# Patient Record
Sex: Female | Born: 1950 | Race: White | Hispanic: No | Smoking: Never smoker
Health system: Southern US, Community
[De-identification: ages and names within clinical notes are randomized; demographics above are authoritative.]

## PROBLEM LIST (undated history)

## (undated) DIAGNOSIS — E119 Type 2 diabetes mellitus without complications: Secondary | ICD-10-CM

## (undated) HISTORY — PX: ANKLE RECONSTRUCTION: SHX1151

## (undated) HISTORY — PX: CHOLECYSTECTOMY: SHX55

---

## 1998-08-07 ENCOUNTER — Other Ambulatory Visit: Admission: RE | Admit: 1998-08-07 | Discharge: 1998-08-07 | Payer: Self-pay | Admitting: Obstetrics

## 1999-04-13 ENCOUNTER — Ambulatory Visit (HOSPITAL_COMMUNITY): Admission: RE | Admit: 1999-04-13 | Discharge: 1999-04-13 | Payer: Self-pay | Admitting: Gastroenterology

## 1999-04-13 ENCOUNTER — Encounter: Payer: Self-pay | Admitting: Gastroenterology

## 1999-04-30 ENCOUNTER — Observation Stay (HOSPITAL_COMMUNITY): Admission: RE | Admit: 1999-04-30 | Discharge: 1999-05-01 | Payer: Self-pay

## 2001-03-02 ENCOUNTER — Emergency Department (HOSPITAL_COMMUNITY): Admission: EM | Admit: 2001-03-02 | Discharge: 2001-03-02 | Payer: Self-pay | Admitting: Internal Medicine

## 2001-05-10 ENCOUNTER — Encounter: Payer: Self-pay | Admitting: Emergency Medicine

## 2001-05-10 ENCOUNTER — Emergency Department (HOSPITAL_COMMUNITY): Admission: EM | Admit: 2001-05-10 | Discharge: 2001-05-10 | Payer: Self-pay | Admitting: Emergency Medicine

## 2002-05-02 ENCOUNTER — Other Ambulatory Visit: Admission: RE | Admit: 2002-05-02 | Discharge: 2002-05-02 | Payer: Self-pay | Admitting: Family Medicine

## 2002-10-01 ENCOUNTER — Ambulatory Visit (HOSPITAL_COMMUNITY): Admission: RE | Admit: 2002-10-01 | Discharge: 2002-10-01 | Payer: Self-pay | Admitting: Gastroenterology

## 2004-04-06 ENCOUNTER — Other Ambulatory Visit: Admission: RE | Admit: 2004-04-06 | Discharge: 2004-04-06 | Payer: Self-pay | Admitting: Family Medicine

## 2006-01-24 ENCOUNTER — Other Ambulatory Visit: Admission: RE | Admit: 2006-01-24 | Discharge: 2006-01-24 | Payer: Self-pay | Admitting: Family Medicine

## 2008-12-27 ENCOUNTER — Encounter: Admission: RE | Admit: 2008-12-27 | Discharge: 2008-12-27 | Payer: Self-pay | Admitting: Family Medicine

## 2009-05-10 ENCOUNTER — Inpatient Hospital Stay (HOSPITAL_COMMUNITY): Admission: EM | Admit: 2009-05-10 | Discharge: 2009-05-13 | Payer: Self-pay | Admitting: Emergency Medicine

## 2009-05-10 ENCOUNTER — Ambulatory Visit: Payer: Self-pay | Admitting: Diagnostic Radiology

## 2009-05-10 ENCOUNTER — Encounter: Payer: Self-pay | Admitting: Emergency Medicine

## 2011-02-22 LAB — GLUCOSE, CAPILLARY
Glucose-Capillary: 141 mg/dL — ABNORMAL HIGH (ref 70–99)
Glucose-Capillary: 153 mg/dL — ABNORMAL HIGH (ref 70–99)
Glucose-Capillary: 168 mg/dL — ABNORMAL HIGH (ref 70–99)
Glucose-Capillary: 180 mg/dL — ABNORMAL HIGH (ref 70–99)
Glucose-Capillary: 183 mg/dL — ABNORMAL HIGH (ref 70–99)
Glucose-Capillary: 229 mg/dL — ABNORMAL HIGH (ref 70–99)
Glucose-Capillary: 275 mg/dL — ABNORMAL HIGH (ref 70–99)

## 2011-02-22 LAB — DIFFERENTIAL
Basophils Absolute: 0.1 10*3/uL (ref 0.0–0.1)
Basophils Relative: 1 % (ref 0–1)
Neutro Abs: 3.7 10*3/uL (ref 1.7–7.7)
Neutrophils Relative %: 64 % (ref 43–77)

## 2011-02-22 LAB — CBC
MCHC: 33.7 g/dL (ref 30.0–36.0)
Platelets: 262 10*3/uL (ref 150–400)
RDW: 12.3 % (ref 11.5–15.5)

## 2011-02-22 LAB — HEMOGLOBIN A1C: Hgb A1c MFr Bld: 7.6 % — ABNORMAL HIGH (ref 4.6–6.1)

## 2011-02-22 LAB — BASIC METABOLIC PANEL
CO2: 24 mEq/L (ref 19–32)
Calcium: 8.7 mg/dL (ref 8.4–10.5)
Creatinine, Ser: 0.7 mg/dL (ref 0.4–1.2)
GFR calc Af Amer: >60 mL/min (ref >60)
Glucose, Bld: 255 mg/dL — ABNORMAL HIGH (ref 70–99)

## 2011-02-22 LAB — PROTIME-INR
INR: 0.9 (ref 0.00–1.49)
Prothrombin Time: 12.5 seconds (ref 11.6–15.2)

## 2011-02-22 LAB — APTT: aPTT: 29 seconds (ref 24–37)

## 2011-03-30 NOTE — H&P (Signed)
NAMEDIONE, PETRON NO.:  [id number]   MEDICAL RECORD NO.:  [id number]          PATIENT TYPE:  INP   LOCATION:  0098                         FACILITY:  [facility]   PHYSICIAN:  Myrtie Neither, MD      DATE OF BIRTH:  Nov 23, 1950   DATE OF ADMISSION:  05/10/2009  DATE OF DISCHARGE:                              HISTORY & PHYSICAL   CHIEF COMPLAINT:  Painful, deformed, left ankle.   HISTORY OF PRESENT ILLNESS:  This is a 60 year old female, who states  she was out running her dog for some exercise and lost her footing,  twisting her left ankle.  The patient was unable to get off the surface  and was unable to walk due to pain in the left ankle.  The patient was  initially seen at [facility] and found to have a  bimalleolar fracture of the left ankle with subluxation and had gentle  reduction of the subluxation and posterior splint applied.  The patient  was then transferred to [facility] for further treatment.  The  patient denies any loss of consciousness or any other injuries.   PAST MEDICAL HISTORY:  1. Diabetes mellitus, type 2.  2. Cholecystectomy.   ALLERGIES:  1. COMPAZINE.  2. HYDROCODONE.   MEDICATIONS:  Metformin 500 mg t.i.d.   SOCIAL HISTORY:  Occasional use of alcohol, but denies use of tobacco or  illegal drugs.   FAMILY HISTORY:  Sibling with diabetes mellitus and cancer as well as in  her parents, and hypertension.   PHYSICAL EXAMINATION:  GENERAL:  Alert and oriented in no acute distress  lying in the supine position with posterior splint on left lower  extremity.  VITAL SIGNS:  Temperature 97.4, pulse 98, respiration 18, blood pressure  134/59, O2 saturation 99%.  HEAD:  Normocephalic.  Sclerae are clear.  NECK:  Supple.  CHEST:  Clear.  CARDIOVASCULAR:  Regular.  EXTREMITY:  Left lower extremity with a posterior splint in place, the  nailbeds pink and blanch sluggishly, cool to touch.  Patient is able to  actively move the toes quite well.   X-RAY:  Bimalleolar fracture and subluxation, left ankle.   IMPRESSION:  Bimalleolar fracture and subluxation, left ankle.   PLAN:  Open reduction and internal fixation of the left ankle.      Myrtie Neither, MD  Electronically Signed     AC/MEDQ  D:  05/10/2009  T:  05/10/2009  Job:  [id number]

## 2011-03-30 NOTE — Op Note (Signed)
NAMESARYN, CHERRY NO.:  [id number]   MEDICAL RECORD NO.:  [id number]          PATIENT TYPE:  INP   LOCATION:  1603                         FACILITY:  [facility]   PHYSICIAN:  Myrtie Neither, MD      DATE OF BIRTH:  1951/08/03   DATE OF PROCEDURE:  05/10/2009  DATE OF DISCHARGE:                               OPERATIVE REPORT   PREOPERATIVE DIAGNOSIS:  Bimalleolar fracture left ankle.   POSTOPERATIVE DIAGNOSIS:  Bimalleolar fracture left ankle.   ANESTHESIA:  General.   PROCEDURE:  Open reduction internal fixation bimalleolar ankle fracture.   The patient was taken to the operating room after given adequate preop  medications, given general anesthesia and intubated.  The left lower  extremity was prepped with DuraPrep and draped in a sterile manner.  A  tourniquet and Bovie were used for hemostasis.  Mini C-arm was used to  visualize the fracture reduction.  A lateral incision made over the left  ankle going through the skin and subcutaneous tissue.  Soft tissue  subperiosteally elevated from the fracture site.  Manipulated reduction  was done and held with a bone clamp.  A six-hole tubular plate was  placed, applied over the fracture site, holding it in anatomic position.  Five screws were used to stabilize the fracture.  Irrigation was done  followed by wound closure, 2-0 Vicryl for the subcutaneous tissue and  skin staples for the skin.  A medial malleolar incision made going  through the skin and subcutaneous tissue down to the fracture site.  Soft tissue was subperiosteally elevated from the fracture site and  anatomic reduction was obtained with a towel clamp and stabilized with a  K-wire.  Two cannulated screws were placed across the medial malleolus,  holding it in a stable and anatomic position.  Copious irrigation was  done.  Wound closure was then done with 2-0 Vicryl for the fascia and  skin staples for the skin.  A compressive dressing was applied.   The  patient had 14 mL of 0.25% plain Marcaine injected into the area.  A  compressive dressing was applied followed by application of Cam Walker.  The patient tolerated the procedure quite well and went to the recovery  room in stable and satisfactory condition.      Myrtie Neither, MD  Electronically Signed     AC/MEDQ  D:  05/10/2009  T:  05/11/2009  Job:  [id number]

## 2011-04-02 NOTE — Discharge Summary (Signed)
Nicole Baker, Nicole Baker NO.:  [id number]   MEDICAL RECORD NO.:  [id number]          PATIENT TYPE:  INP   LOCATION:  1603                         FACILITY:  [facility]   PHYSICIAN:  Myrtie Neither, MD      DATE OF BIRTH:  10-30-1951   DATE OF ADMISSION:  05/10/2009  DATE OF DISCHARGE:  05/13/2009                               DISCHARGE SUMMARY   ADMITTING DIAGNOSIS:  Bimalleolar fracture dislocation left ankle.   DISCHARGE DIAGNOSIS:  Bimalleolar fracture dislocation left ankle.   COMPLICATIONS:  None.   INFECTIONS:  None.   OPERATION:  ORIF left ankle.   PERTINENT HISTORY:  This is a 60 year old female who was jogging with  her dog, and the dog got caught between her foot and ankle causing her  to fall and sustain injury to her left ankle.  The patient denies any  other injury, no head injury, no  loss of consciousness, and was brought  to [facility] emergency room for treatment.  The patient was initially  taken to [facility] urgent care center, and then was transferred to  [facility] emergency room.   PERTINENT PHYSICAL:  __________ of the left ankle, tender, swollen,  ecchymosis.  Neurovascular status was intact.  Nail beds pink and blanch  sluggishly.  X-ray revealed bimalleolar fracture of the left ankle with  subluxation.   HOSPITAL COURSE:  The patient underwent preop laboratory, CBC, EKG,  chest x-ray, CMET, PT/PTT, UA.  The patient's labs were stable enough to  undergo surgery.  The patient underwent ORIF of the left ankle.  Tolerated the procedure quite well.  Postop course fairly benign.  Ice  packs, elevation, physical therapy, nonweightbearing on the left side.  The patient's pain was brought under control with the use of Percocet 1-  2 q.6 p.r.n.  The patient is able to be discharged home nonweightbearing  on the left side, Cam walker, Percocet 5 mg 1-2 q.6 p.r.n., __________  b.i.d., and to return to the office in 1 week.  Patient is instructed  on  ice packs and elevation of the left lower extremity.  The patient was  discharged in stable and satisfactory condition.      Myrtie Neither, MD  Electronically Signed     AC/MEDQ  D:  05/28/2009  T:  05/28/2009  Job:  [id number]

## 2017-03-19 ENCOUNTER — Emergency Department (HOSPITAL_BASED_OUTPATIENT_CLINIC_OR_DEPARTMENT_OTHER)
Admission: EM | Admit: 2017-03-19 | Discharge: 2017-03-19 | Disposition: A | Discharge status: Home / Self Care | Payer: Managed Care, Other (non HMO) | Attending: Emergency Medicine | Admitting: Emergency Medicine

## 2017-03-19 ENCOUNTER — Encounter (HOSPITAL_BASED_OUTPATIENT_CLINIC_OR_DEPARTMENT_OTHER): Payer: Self-pay | Admitting: Emergency Medicine

## 2017-03-19 DIAGNOSIS — M545 Low back pain, unspecified: Secondary | ICD-10-CM

## 2017-03-19 DIAGNOSIS — E119 Type 2 diabetes mellitus without complications: Secondary | ICD-10-CM | POA: Insufficient documentation

## 2017-03-19 DIAGNOSIS — Z7984 Long term (current) use of oral hypoglycemic drugs: Secondary | ICD-10-CM | POA: Diagnosis not present

## 2017-03-19 HISTORY — DX: Type 2 diabetes mellitus without complications: E11.9

## 2017-03-19 LAB — URINALYSIS, ROUTINE W REFLEX MICROSCOPIC
Bilirubin Urine: NEGATIVE
Glucose, UA: >=500 mg/dL — AB
Hgb urine dipstick: NEGATIVE
Ketones, ur: 40 mg/dL — AB
Leukocytes, UA: NEGATIVE
Nitrite: NEGATIVE
Protein, ur: 30 mg/dL — AB
Specific Gravity, Urine: 1.031 — ABNORMAL HIGH (ref 1.005–1.030)
pH: 5 (ref 5.0–8.0)

## 2017-03-19 LAB — URINALYSIS, MICROSCOPIC (REFLEX): RBC / HPF: NONE SEEN RBC/hpf (ref 0–5)

## 2017-03-19 MED ORDER — DEXAMETHASONE 4 MG PO TABS
8.0000 mg | ORAL_TABLET | Freq: Once | ORAL | Status: AC
  Administered 2017-03-19: 8 mg via ORAL
  Filled 2017-03-19: qty 2

## 2017-03-19 MED ORDER — NAPROXEN 375 MG PO TABS: 375.0000 mg | ORAL_TABLET | Freq: Two times a day (BID) | ORAL | 0 refills | Status: AC | PRN

## 2017-03-19 MED ORDER — DIAZEPAM 5 MG PO TABS: 2.5000 mg | ORAL_TABLET | Freq: Three times a day (TID) | ORAL | 0 refills | Status: AC | PRN

## 2017-03-19 MED ORDER — KETOROLAC TROMETHAMINE 15 MG/ML IJ SOLN
15.0000 mg | Freq: Once | INTRAMUSCULAR | Status: AC
  Administered 2017-03-19: 15 mg via INTRAMUSCULAR
  Filled 2017-03-19: qty 1

## 2017-03-19 MED ORDER — LORAZEPAM 1 MG PO TABS
0.5000 mg | ORAL_TABLET | Freq: Once | ORAL | Status: DC
  Filled 2017-03-19: qty 1

## 2017-03-19 MED ORDER — HYDROMORPHONE HCL 1 MG/ML IJ SOLN
0.5000 mg | Freq: Once | INTRAMUSCULAR | Status: DC
Start: 2017-03-19 — End: 2017-03-19
  Filled 2017-03-19: qty 1

## 2017-03-19 NOTE — ED Provider Notes (Signed)
[facility] Provider Note   CSN: [id number] Arrival date & time: 03/19/17  1552   By signing my name below, I, Clarisse Gouge, attest that this documentation has been prepared under the direction and in the presence of Raeford Razor, MD. Electronically signed, Clarisse Gouge, ED Scribe. 03/19/17. 7:22 PM.   History   Chief Complaint Chief Complaint  Patient presents with  . Back Pain   The history is provided by the patient and medical records. No language interpreter was used.  Back Pain   This is a new problem. The current episode started more than 2 days ago. The problem occurs constantly. The problem has been gradually worsening. The pain is associated with no known injury. The pain is present in the lumbar spine. The quality of the pain is described as aching. The pain does not radiate. The pain is at a severity of 10/10. The pain is severe. The symptoms are aggravated by certain positions and twisting. The pain is worse during the night. Pertinent negatives include no numbness, no abdominal pain, no abdominal swelling, no bowel incontinence, no dysuria, no tingling and no weakness. She has tried NSAIDs for the symptoms. The treatment provided no relief.    Nicole Baker is a 66 y.o. female h/o DM, who presents to the Emergency Department with concern for persistent, gradually worsening, atraumatic low back pain x 5 days. She states she was working when the back pain originally came on. Pt describes 10/10, throbbing, constant low back pain worsened with movement and certain positions. Pt allegedly seen at [facility] yesterday with imaging and UA performed at the time. Pt prescribed tramadol, baclofen, macrobid and dexamethazone for this problem. She reports secondary, persistent N/V from this course of treatment and she states the medications have not modified her pain. No other modifying factors noted. Pt evaluated for abdominal pain and vaginal discharge 1 week ago; Pt prescribed  ibuprofen at the time and states her symptoms have subsided from that time. H/o traumatic back pain from a fall years ago. No urinary sx's or any other complaints noted at this time.   Past Medical History:  Diagnosis Date  . Diabetes mellitus without complication (HCC)     There are no active problems to display for this patient.   Past Surgical History:  Procedure Laterality Date  . ANKLE RECONSTRUCTION Left   . CHOLECYSTECTOMY      OB History    No data available       Home Medications    Prior to Admission medications   Medication Sig Start Date End Date Taking? Authorizing Provider  metFORMIN (GLUCOPHAGE) 1000 MG tablet Take 1,000 mg by mouth 2 (two) times daily with a meal.   Yes [provider]    Family History No family history on file.  Social History Social History  Substance Use Topics  . Smoking status: Never Smoker  . Smokeless tobacco: Never Used  . Alcohol use No     Allergies   Compazine [prochlorperazine edisylate] and Hydrocodone   Review of Systems Review of Systems  Gastrointestinal: Negative for abdominal pain and bowel incontinence.  Genitourinary: Negative for dysuria.  Musculoskeletal: Positive for back pain.  Skin: Negative for wound.  Neurological: Negative for tingling, weakness and numbness.  All other systems reviewed and are negative.    Physical Exam Updated Vital Signs BP 128/60 (BP Location: Right Arm)   Pulse 76   Temp 98.5 F (36.9 C) (Oral)   Resp (!) 21  Ht 5\' 2"  (1.575 m)   Wt 149 lb (67.6 kg)   SpO2 97%   BMI 27.25 kg/m   Physical Exam  Constitutional: She is oriented to person, place, and time. She appears well-developed and well-nourished.  HENT:  Head: Normocephalic.  Eyes: EOM are normal.  Neck: Normal range of motion.  Pulmonary/Chest: Effort normal.  Abdominal: She exhibits no distension.  Musculoskeletal: Normal range of motion.  Back pain not reproducible, strength 5/5 BLE's,  palpable D/P pulses  Neurological: She is alert and oriented to person, place, and time.  Psychiatric: She has a normal mood and affect.  Nursing note and vitals reviewed.    ED Treatments / Results  DIAGNOSTIC STUDIES: Oxygen Saturation is 97% on RA, NL by my interpretation.    COORDINATION OF CARE: 7:10 PM-Discussed next steps with pt. Pt verbalized understanding and is agreeable with the plan. Will order and Rx medications, then reassess. Pt prepared for d/c pending response to treatment, advised of symptomatic care at home and return precautions.    Labs (all labs ordered are listed, but only abnormal results are displayed) Labs Reviewed  URINALYSIS, ROUTINE W REFLEX MICROSCOPIC - Abnormal; Notable for the following:       Result Value   Specific Gravity, Urine 1.031 (*)    Glucose, UA >=500 (*)    Ketones, ur 40 (*)    Protein, ur 30 (*)    All other components within normal limits  URINALYSIS, MICROSCOPIC (REFLEX) - Abnormal; Notable for the following:    Bacteria, UA FEW (*)    Squamous Epithelial / LPF 0-5 (*)    All other components within normal limits    EKG  EKG Interpretation None       Radiology No results found.  Procedures Procedures (including critical care time)  Medications Ordered in ED Medications - No data to display   Initial Impression / Assessment and Plan / ED Course  I have reviewed the triage vital signs and the nursing notes.  Pertinent labs & imaging results that were available during my care of the patient were reviewed by me and considered in my medical decision making (see chart for details).     65yF with back pain w/o significant red flags. No trauma. Nonfocal neuro exam. Plan symptomatic tx. Doubt emergent cause (SEA/SEH, osteo, or other potential cord compromising process.   Final Clinical Impressions(s) / ED Diagnoses   Final diagnoses:  Acute low back pain without sciatica, unspecified back pain laterality    New  Prescriptions New Prescriptions   No medications on file    I personally preformed the services scribed in my presence. The recorded information has been reviewed is accurate. Raeford RazorStephen Sharlot Sturkey, MD.    Raeford RazorKohut, Fabiana Dromgoole, MD 03/27/17 [phone]

## 2017-03-19 NOTE — Discharge Instructions (Signed)
Take dexamethasone as previously prescribed. Stop macrobid, tramadol and baclofen.

## 2017-03-19 NOTE — ED Triage Notes (Signed)
Lower back pain on Tuesday , denies recent injury . Was eval by Urgent care yesterday and was given baclofen and steroids and that has caused persist n/v. Denies urinary s/s

## 2017-04-14 ENCOUNTER — Emergency Department (HOSPITAL_BASED_OUTPATIENT_CLINIC_OR_DEPARTMENT_OTHER)
Admission: EM | Admit: 2017-04-14 | Discharge: 2017-04-14 | Disposition: A | Discharge status: Home / Self Care | Payer: Managed Care, Other (non HMO) | Attending: Emergency Medicine | Admitting: Emergency Medicine

## 2017-04-14 ENCOUNTER — Encounter (HOSPITAL_BASED_OUTPATIENT_CLINIC_OR_DEPARTMENT_OTHER): Payer: Self-pay | Admitting: *Deleted

## 2017-04-14 ENCOUNTER — Emergency Department (HOSPITAL_BASED_OUTPATIENT_CLINIC_OR_DEPARTMENT_OTHER): Payer: Managed Care, Other (non HMO)

## 2017-04-14 DIAGNOSIS — Z7984 Long term (current) use of oral hypoglycemic drugs: Secondary | ICD-10-CM | POA: Diagnosis not present

## 2017-04-14 DIAGNOSIS — Y9301 Activity, walking, marching and hiking: Secondary | ICD-10-CM | POA: Insufficient documentation

## 2017-04-14 DIAGNOSIS — S92515A Nondisplaced fracture of proximal phalanx of left lesser toe(s), initial encounter for closed fracture: Secondary | ICD-10-CM | POA: Insufficient documentation

## 2017-04-14 DIAGNOSIS — Y92019 Unspecified place in single-family (private) house as the place of occurrence of the external cause: Secondary | ICD-10-CM | POA: Diagnosis not present

## 2017-04-14 DIAGNOSIS — E119 Type 2 diabetes mellitus without complications: Secondary | ICD-10-CM | POA: Insufficient documentation

## 2017-04-14 DIAGNOSIS — W2209XA Striking against other stationary object, initial encounter: Secondary | ICD-10-CM | POA: Diagnosis not present

## 2017-04-14 DIAGNOSIS — S99922A Unspecified injury of left foot, initial encounter: Secondary | ICD-10-CM | POA: Diagnosis present

## 2017-04-14 DIAGNOSIS — Y998 Other external cause status: Secondary | ICD-10-CM | POA: Diagnosis not present

## 2017-04-14 NOTE — ED Provider Notes (Signed)
[facility] Provider Note   CSN: [id number] Arrival date & time: 04/14/17  1358     History   Chief Complaint Chief Complaint  Patient presents with  . Foot Injury    HPI Nicole Baker is a [age] y.o. female.  HPI   Pt with hx DM p/w injury to her left foot and 5th toe that occurred last night.  She was walking in her appointment and accidentally kicked a cardboard box with her left foot.  States her 5th toe was facing outward away from the other toes and she has pain in it since.  Has taken ibuprofen with some relief.  Denies any other injury.  No break in skin.    Past Medical History:  Diagnosis Date  . Diabetes mellitus without complication (HCC)     There are no active problems to display for this patient.   Past Surgical History:  Procedure Laterality Date  . ANKLE RECONSTRUCTION Left   . CHOLECYSTECTOMY      OB History    No data available       Home Medications    Prior to Admission medications   Medication Sig Start Date End Date Taking? Authorizing Provider  diazepam (VALIUM) 5 MG tablet Take 0.5-1 tablets (2.5-5 mg total) by mouth every 8 (eight) hours as needed for muscle spasms. 03/19/17   Raeford RazorKohut, Stephen, MD  metFORMIN (GLUCOPHAGE) 1000 MG tablet Take 1,000 mg by mouth 2 (two) times daily with a meal.    [provider]  naproxen (NAPROSYN) 375 MG tablet Take 1 tablet (375 mg total) by mouth 2 (two) times daily as needed. 03/19/17   Raeford RazorKohut, Stephen, MD    Family History No family history on file.  Social History Social History  Substance Use Topics  . Smoking status: Never Smoker  . Smokeless tobacco: Never Used  . Alcohol use No     Allergies   Compazine [prochlorperazine edisylate] and Hydrocodone   Review of Systems Review of Systems  Constitutional: Negative for fever.  Cardiovascular: Negative for leg swelling.  Musculoskeletal: Positive for arthralgias.  Skin: Positive for color change. Negative for wound.    Neurological: Negative for weakness and numbness.  Hematological: Does not bruise/bleed easily.  Psychiatric/Behavioral: Positive for self-injury (accidental ).     Physical Exam Updated Vital Signs BP (!) 144/70 (BP Location: Right Arm)   Pulse 74   Temp 98.7 F (37.1 C) (Oral)   Resp 18   Ht 5\' 2"  (1.575 m)   Wt 67.6 kg (149 lb)   SpO2 96%   BMI 27.25 kg/m   Physical Exam  Constitutional: She appears well-developed and well-nourished. No distress.  HENT:  Head: Normocephalic and atraumatic.  Neck: Neck supple.  Pulmonary/Chest: Effort normal.  Musculoskeletal:  Left foot with ecchymosis overlying 3rd-5th MTPs, tenderness in 5th toe.  No break in skin.  Sensation intact, capillary refill < 2 seconds.  No other focal tenderness throughout left lower extremity.    Neurological: She is alert.  Skin: She is not diaphoretic.  Nursing note and vitals reviewed.    ED Treatments / Results  Labs (all labs ordered are listed, but only abnormal results are displayed) Labs Reviewed - No data to display  EKG  EKG Interpretation None       Radiology Dg Toe 5th Left  Result Date: 04/14/2017 CLINICAL DATA:  Fifth toe injury on box yesterday. Initial encounter. EXAM: DG TOE 5TH LEFT COMPARISON:  None. FINDINGS: Acute fracture involving  the shaft of the fifth proximal phalanx with minimal lateral impaction. No dislocation. IMPRESSION: Minimally impacted shaft fracture of the fifth proximal phalanx. Electronically Signed   By: Marnee Spring M.D.   On: 04/14/2017 14:24    Procedures Procedures (including critical care time)  Medications Ordered in ED Medications - No data to display   Initial Impression / Assessment and Plan / ED Course  I have reviewed the triage vital signs and the nursing notes.  Pertinent labs & imaging results that were available during my care of the patient were reviewed by me and considered in my medical decision making (see chart for  details).     Afebrile, nontoxic patient with injury to her left 5th toe while walking in her apartment.   Xray demonstrates fracture of proximal phalanx 5th toe.   D/C home with buddy taping, postop shoe, PCP follow up.  She declined crutches.  Discussed result, findings, treatment, and follow up  with patient.  Pt given return precautions.  Pt verbalizes understanding and agrees with plan.      Final Clinical Impressions(s) / ED Diagnoses   Final diagnoses:  Closed nondisplaced fracture of proximal phalanx of lesser toe of left foot, initial encounter    New Prescriptions Discharge Medication List as of 04/14/2017  3:04 PM       Trixie Dredge, PA-C 04/14/17 1644    Geoffery Lyons, MD 04/15/17 [phone]

## 2017-04-14 NOTE — Discharge Instructions (Signed)
Read the information below.  You may return to the Emergency Department at any time for worsening condition or any new symptoms that concern you.   If you develop uncontrolled pain, weakness or numbness of the extremity, severe discoloration of the skin, or you are unable to walk, return to the ER for a recheck.    °

## 2017-04-14 NOTE — ED Triage Notes (Signed)
She hit her left 5th toe on a cardboard box 2 days ago.

## 2017-07-07 ENCOUNTER — Emergency Department (HOSPITAL_COMMUNITY)
Admission: EM | Admit: 2017-07-07 | Discharge: 2017-07-07 | Disposition: A | Discharge status: Home / Self Care | Payer: Worker's Compensation | Attending: Emergency Medicine | Admitting: Emergency Medicine

## 2017-07-07 ENCOUNTER — Emergency Department (HOSPITAL_COMMUNITY): Payer: Worker's Compensation

## 2017-07-07 ENCOUNTER — Encounter (HOSPITAL_COMMUNITY): Payer: Self-pay | Admitting: Emergency Medicine

## 2017-07-07 DIAGNOSIS — W01198A Fall on same level from slipping, tripping and stumbling with subsequent striking against other object, initial encounter: Secondary | ICD-10-CM | POA: Insufficient documentation

## 2017-07-07 DIAGNOSIS — W19XXXA Unspecified fall, initial encounter: Secondary | ICD-10-CM

## 2017-07-07 DIAGNOSIS — Y990 Civilian activity done for income or pay: Secondary | ICD-10-CM | POA: Insufficient documentation

## 2017-07-07 DIAGNOSIS — M542 Cervicalgia: Secondary | ICD-10-CM | POA: Diagnosis present

## 2017-07-07 DIAGNOSIS — M25561 Pain in right knee: Secondary | ICD-10-CM

## 2017-07-07 DIAGNOSIS — E119 Type 2 diabetes mellitus without complications: Secondary | ICD-10-CM | POA: Diagnosis not present

## 2017-07-07 DIAGNOSIS — Z7984 Long term (current) use of oral hypoglycemic drugs: Secondary | ICD-10-CM | POA: Insufficient documentation

## 2017-07-07 DIAGNOSIS — Y9301 Activity, walking, marching and hiking: Secondary | ICD-10-CM | POA: Insufficient documentation

## 2017-07-07 DIAGNOSIS — Y9259 Other trade areas as the place of occurrence of the external cause: Secondary | ICD-10-CM | POA: Diagnosis not present

## 2017-07-07 MED ORDER — CYCLOBENZAPRINE HCL 5 MG PO TABS: 5.0000 mg | ORAL_TABLET | Freq: Two times a day (BID) | ORAL | 0 refills | Status: AC | PRN

## 2017-07-07 MED ORDER — IBUPROFEN 800 MG PO TABS: 800.0000 mg | ORAL_TABLET | Freq: Three times a day (TID) | ORAL | 0 refills | Status: AC

## 2017-07-07 NOTE — Discharge Instructions (Signed)
You may follow up with your primary care provider and let them know that there was a small effusion (fluid inside the knee) on your right knee x-ray. They can refer you to an orthopedist if you would like to evaluate this there. The bony parts of the knee were unremarkable for broken bones/fractures.   You may take 800 mg of ibuprofen every 8 hours with food as needed for pain control. Flexeril as a medication for muscle spasms and tightness and be taken up to 2 times per day. Please do not work or drive after taking this medication because it can make you sleepy.  If you develop any new or worsening symptoms including visual changes, dizziness, numbness, or weakness, please return to the emergency department for reevaluation.

## 2017-07-07 NOTE — ED Triage Notes (Signed)
Per EMS-states fell at work-slipped on concrete floor landing on left side-complaining of right knee and hip pain-and left shoulder and neck pain-no obvious deformities

## 2017-07-07 NOTE — ED Provider Notes (Signed)
[facility]-EMERGENCY DEPT Provider Note   CSN: [id number] Arrival date & time: 07/07/17  1053     History   Chief Complaint Chief Complaint  Patient presents with  . Fall    HPI Nicole Baker is a 66 y.o. female Who presents to the emergency department with a chief complaint of fall. The patient reports that she was at work and slipped on a piece of paper that had fallen on the ground while walking and fell from a standing position onto the concrete floor and landed on her left side. She denies HA, dizziness or lightheadedness.She denies hitting her head, LOC, nausea, or emesis. She denies numbness, weakness, chest pain, shortness of breath, abdominal pain, or back pain. She reports that her coworkers would not allow her to stand up or move until EMS arrived and she was evaluated by the paramedics. She reports after evaluation that she was able to walk. In the ED, she complains of left lateral neck pain and right knee pain. She states that she is unsure of why her right knee is painful or bruised since she states that she did not land on it. She reports previous injuries to her bilateral ankles and knees from several years ago.  Past medical history includes diabetes. She does not take any anticoagulation or antiplatelet medications.   The history is provided by the patient. No language interpreter was used.    Past Medical History:  Diagnosis Date  . Diabetes mellitus without complication (HCC)     There are no active problems to display for this patient.   Past Surgical History:  Procedure Laterality Date  . ANKLE RECONSTRUCTION Left   . CHOLECYSTECTOMY      OB History    No data available       Home Medications    Prior to Admission medications   Medication Sig Start Date End Date Taking? Authorizing Provider  cyclobenzaprine (FLEXERIL) 5 MG tablet Take 1 tablet (5 mg total) by mouth 2 (two) times daily as needed for muscle spasms. 07/07/17   Thadius Smisek A, PA-C    diazepam (VALIUM) 5 MG tablet Take 0.5-1 tablets (2.5-5 mg total) by mouth every 8 (eight) hours as needed for muscle spasms. 03/19/17   Raeford Razor, MD  ibuprofen (ADVIL,MOTRIN) 800 MG tablet Take 1 tablet (800 mg total) by mouth 3 (three) times daily. 07/07/17   Aime Carreras A, PA-C  metFORMIN (GLUCOPHAGE) 1000 MG tablet Take 1,000 mg by mouth 2 (two) times daily with a meal.    [provider]  naproxen (NAPROSYN) 375 MG tablet Take 1 tablet (375 mg total) by mouth 2 (two) times daily as needed. 03/19/17   Raeford Razor, MD    Family History No family history on file.  Social History Social History  Substance Use Topics  . Smoking status: Never Smoker  . Smokeless tobacco: Never Used  . Alcohol use No     Allergies   Compazine [prochlorperazine edisylate] and Hydrocodone   Review of Systems Review of Systems  Respiratory: Negative for shortness of breath.   Cardiovascular: Negative for chest pain.  Gastrointestinal: Negative for abdominal pain.  Musculoskeletal: Positive for arthralgias and myalgias. Negative for back pain, gait problem and neck pain.  Skin: Positive for color change.  Allergic/Immunologic: Positive for immunocompromised state.  Neurological: Negative for dizziness, syncope, weakness, light-headedness, numbness and headaches.     Physical Exam Updated Vital Signs BP (!) 151/81 (BP Location: Left Arm)   Pulse 77  Temp (!) 97.5 F (36.4 C) (Oral)   Resp 17   SpO2 99%   Physical Exam  Constitutional: She is oriented to person, place, and time. She appears well-developed and well-nourished. No distress.  HENT:  Head: Normocephalic.  Eyes: Conjunctivae are normal.  Neck: Neck supple.  Cardiovascular: Normal rate, regular rhythm and normal heart sounds.  Exam reveals no gallop and no friction rub.   No murmur heard. Radial, DP, and PT pulses are 2+ and equal.  Pulmonary/Chest: Effort normal and breath sounds normal. No respiratory  distress. She has no wheezes. She has no rales.  Abdominal: Soft. She exhibits no distension. There is no tenderness.  Musculoskeletal:  No tenderness to palpation over the spinous processes of the cervical, thoracic, or lumbar spine or surrounding paraspinal muscles.  The bilateral shoulders, hips, wrists, and ankles are non-tender to palpation with full active and passive range of motion. Full active and passive range of motion to the left knee, which is non-tender to palpation.  Tender to palpation over the medial joint lines and patella of the right knee. There is a small area of ecchymosis overlying the anterior patella. Negative anterior and posterior drawer test. Negative valgus and varus stress test. Increased pain with flexion of the knee.  Tender to palpation over the left lateral neck, superior to the left clavicle. The bilateral clavicles are non-tender. Full range of motion of the neck with flexion, extension, lateral flexion, and rotation.   Neurological: She is alert and oriented to person, place, and time.  Skin: Skin is warm. No rash noted. She is not diaphoretic.  Psychiatric: Her behavior is normal.  Nursing note and vitals reviewed.    ED Treatments / Results  Labs (all labs ordered are listed, but only abnormal results are displayed) Labs Reviewed - No data to display  EKG  EKG Interpretation None       Radiology Dg Chest 2 View  Result Date: 07/07/2017 STAT CLINICAL DATA: Most fall onto a concrete surface today. The patient reports left shoulder and neck pain EXAM: CHEST  2 VIEW COMPARISON:  Chest x-ray of May 10, 2009 FINDINGS: The lungs are adequately inflated and clear. The heart and mediastinal structures are normal. There is no pleural effusion or pneumothorax. The clavicles are intact. The observed portions of the ribs are normal. 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 IMPRESSION: There is no evidence of acute post  traumatic injury of the thorax. There is no acute cardiopulmonary abnormality. Electronically Signed   By: David  [place] M.D.   On: 07/07/2017 14:42   Dg Knee 2 Views Right  Result Date: 07/07/2017 CLINICAL DATA:  Slipped at work falling onto a concrete surface. The patient reports right knee pain EXAM: RIGHT KNEE - 1-2 VIEW COMPARISON:  None in PACs FINDINGS: The bones are subjectively adequately mineralized. There is no acute fracture or dislocation. The joint spaces are reasonably well-maintained. There is a small suprapatellar effusion. IMPRESSION: There is no acute or chronic bony abnormality of the right knee. There is a small suprapatellar effusion. Electronically Signed   By: David  [place] M.D.   On: 07/07/2017 14:43    Procedures Procedures (including critical care time)  Medications Ordered in ED Medications - No data to display   Initial Impression / Assessment and Plan / ED Course  I have reviewed the triage vital signs and the nursing notes.  Pertinent labs & imaging results that were available during my care of  the patient were reviewed by me and considered in my medical decision making (see chart for details).     66 year old female presenting with a chief complaint of fall from standing after slipping on a piece of paper on a concrete floor work earlier today. The patient was seen and evaluated by Dr. Jacqulyn Bath, attending physician. She is not complaining of left lateral neck pain and right knee pain. Chest x-ray unremarkable. Right knee x-ray demonstrating a small effusion of the knee. Discussed with the patient referring her to orthopedics versus following up with primary care to get a referral. She reports that she would like to follow up with primary care for referral. The patient has no other complaints at this time. Will discharge home with anti-inflammatories and Flexeril. Strict return precautions given. No acute distress. Vital signs stable. The patient is safe for discharge  at this time.  Final Clinical Impressions(s) / ED Diagnoses   Final diagnoses:  Fall, initial encounter  Acute pain of right knee  Neck pain on left side    New Prescriptions New Prescriptions   CYCLOBENZAPRINE (FLEXERIL) 5 MG TABLET    Take 1 tablet (5 mg total) by mouth 2 (two) times daily as needed for muscle spasms.   IBUPROFEN (ADVIL,MOTRIN) 800 MG TABLET    Take 1 tablet (800 mg total) by mouth 3 (three) times daily.     Barkley Boards, PA-C 07/07/17 1519    Maia Plan, MD 07/07/17 1919

## 2019-05-14 IMAGING — CR DG KNEE 1-2V*R*
2 series · 2 of 2 positions shown · non-contrast
Comparison: None in PACs

CLINICAL DATA: Slipped at work falling onto a concrete surface. The
patient reports right knee pain

EXAM:
RIGHT KNEE - 1-2 VIEW

[t knee ap right]
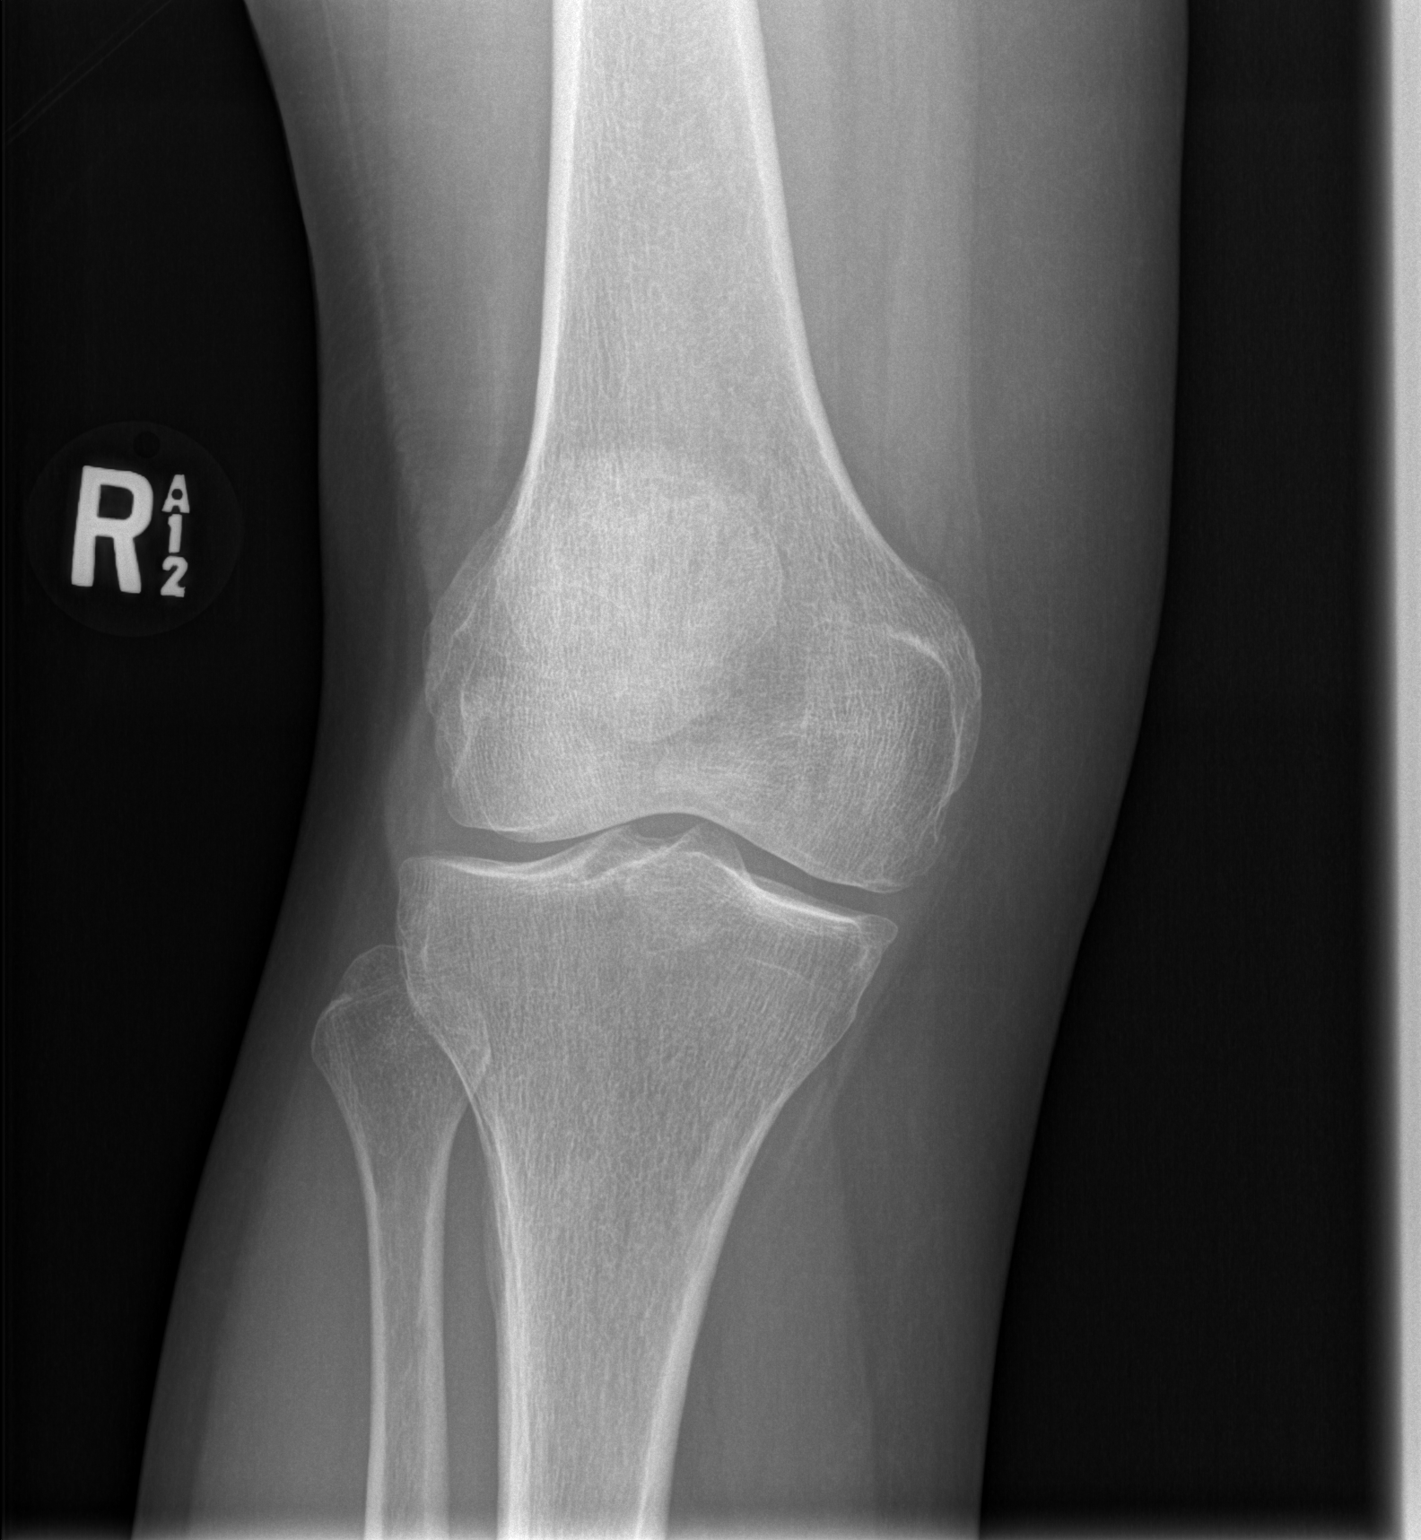

[t knee lat right]
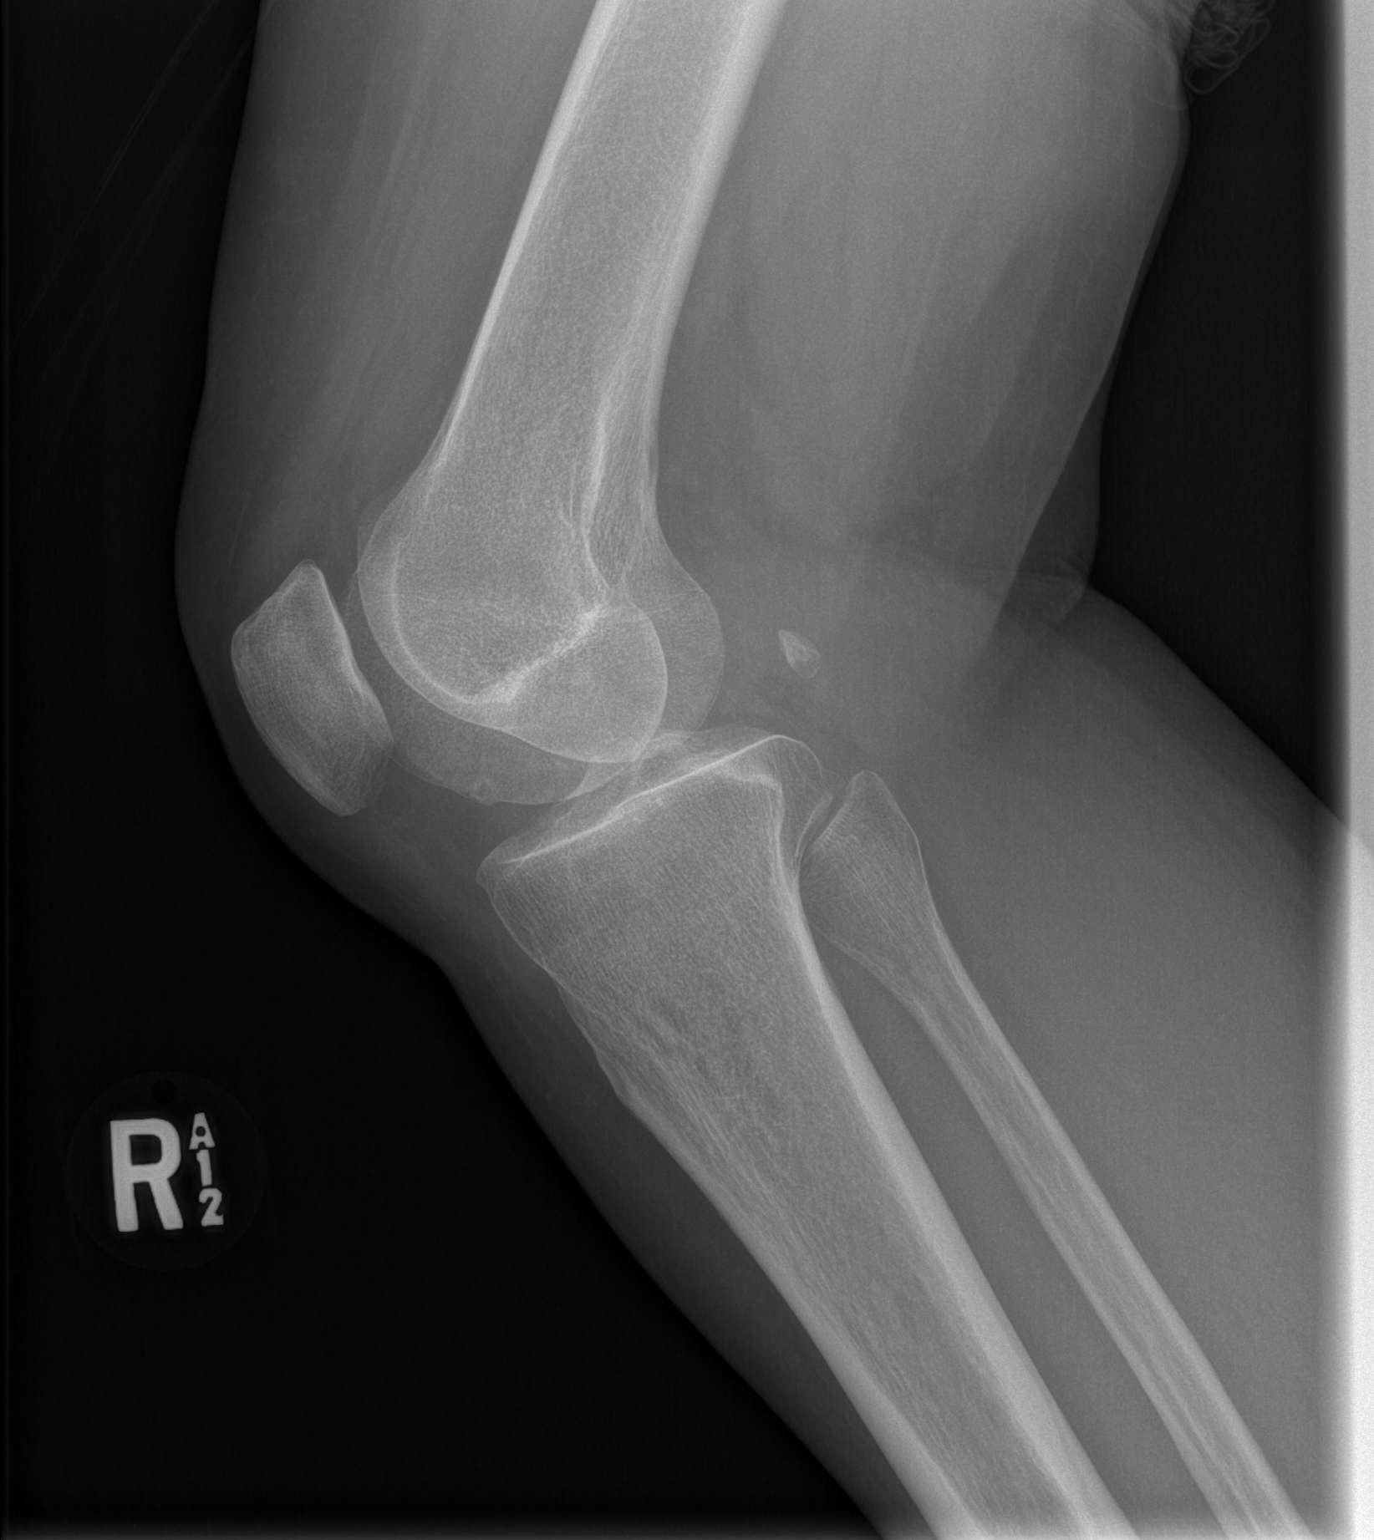

[2 of 2 positions shown; findings below may reference images not displayed]

FINDINGS: The bones are subjectively adequately mineralized. There is no acute
fracture or dislocation. The joint spaces are reasonably
well-maintained. There is a small suprapatellar effusion.
IMPRESSION: There is no acute or chronic bony abnormality of the right knee.
There is a small suprapatellar effusion.
